# Patient Record
Sex: Female | Born: 1970 | Hispanic: Yes | Marital: Married | Smoking: Never smoker
Health system: Southern US, Community
[De-identification: ages and names within clinical notes are randomized; demographics above are authoritative.]

## PROBLEM LIST (undated history)

## (undated) DIAGNOSIS — I10 Essential (primary) hypertension: Secondary | ICD-10-CM

## (undated) DIAGNOSIS — K219 Gastro-esophageal reflux disease without esophagitis: Secondary | ICD-10-CM

## (undated) HISTORY — PX: APPENDECTOMY: SHX54

## (undated) HISTORY — DX: Essential (primary) hypertension: I10

## (undated) HISTORY — DX: Gastro-esophageal reflux disease without esophagitis: K21.9

## (undated) HISTORY — PX: CHOLECYSTECTOMY: SHX55

---

## 2013-03-28 ENCOUNTER — Emergency Department: Payer: Self-pay | Admitting: Emergency Medicine

## 2013-03-28 LAB — COMPREHENSIVE METABOLIC PANEL
BUN: 9 mg/dL (ref 7–18)
Bilirubin,Total: 0.3 mg/dL (ref 0.2–1.0)
Co2: 27 mmol/L (ref 21–32)
Creatinine: 0.65 mg/dL (ref 0.60–1.30)
Glucose: 108 mg/dL — ABNORMAL HIGH (ref 65–99)
Potassium: 3.2 mmol/L — ABNORMAL LOW (ref 3.5–5.1)
SGOT(AST): 25 U/L (ref 15–37)
SGPT (ALT): 26 U/L (ref 12–78)
Sodium: 139 mmol/L (ref 136–145)
Total Protein: 7.7 g/dL (ref 6.4–8.2)

## 2013-03-28 LAB — CBC
HCT: 41.8 % (ref 35.0–47.0)
HGB: 14.6 g/dL (ref 12.0–16.0)
RBC: 4.74 10*6/uL (ref 3.80–5.20)

## 2013-03-29 LAB — TROPONIN I: Troponin-I: <0.02 ng/mL

## 2013-10-25 ENCOUNTER — Emergency Department: Payer: Self-pay | Admitting: Emergency Medicine

## 2013-10-25 LAB — COMPREHENSIVE METABOLIC PANEL
ALT: 23 U/L (ref 12–78)
ANION GAP: 8 (ref 7–16)
AST: 17 U/L (ref 15–37)
Albumin: 3.5 g/dL (ref 3.4–5.0)
Alkaline Phosphatase: 83 U/L
BUN: 16 mg/dL (ref 7–18)
Bilirubin,Total: 0.2 mg/dL (ref 0.2–1.0)
CREATININE: 0.68 mg/dL (ref 0.60–1.30)
Calcium, Total: 8.5 mg/dL (ref 8.5–10.1)
Chloride: 108 mmol/L — ABNORMAL HIGH (ref 98–107)
Co2: 23 mmol/L (ref 21–32)
EGFR (Non-African Amer.): >60
GLUCOSE: 130 mg/dL — AB (ref 65–99)
Osmolality: 280 (ref 275–301)
POTASSIUM: 3.4 mmol/L — AB (ref 3.5–5.1)
SODIUM: 139 mmol/L (ref 136–145)
Total Protein: 7.5 g/dL (ref 6.4–8.2)

## 2013-10-25 LAB — URINALYSIS, COMPLETE
BACTERIA: NONE SEEN
BILIRUBIN, UR: NEGATIVE
BLOOD: NEGATIVE
Glucose,UR: 50 mg/dL (ref 0–75)
KETONE: NEGATIVE
Leukocyte Esterase: NEGATIVE
NITRITE: NEGATIVE
Ph: 6 (ref 4.5–8.0)
Protein: NEGATIVE
RBC, UR: NONE SEEN /HPF (ref 0–5)
Specific Gravity: 1.006 (ref 1.003–1.030)
Squamous Epithelial: <1
WBC UR: NONE SEEN /HPF (ref 0–5)

## 2013-10-25 LAB — CBC
HCT: 39 % (ref 35.0–47.0)
HGB: 12.8 g/dL (ref 12.0–16.0)
MCH: 29.3 pg (ref 26.0–34.0)
MCHC: 32.9 g/dL (ref 32.0–36.0)
MCV: 89 fL (ref 80–100)
PLATELETS: 230 10*3/uL (ref 150–440)
RBC: 4.38 10*6/uL (ref 3.80–5.20)
RDW: 13.2 % (ref 11.5–14.5)
WBC: 8.5 10*3/uL (ref 3.6–11.0)

## 2013-10-25 LAB — TROPONIN I
Troponin-I: <0.02 ng/mL
Troponin-I: <0.02 ng/mL

## 2013-10-25 LAB — LIPASE, BLOOD: LIPASE: 309 U/L (ref 73–393)

## 2014-01-21 ENCOUNTER — Emergency Department: Payer: Self-pay | Admitting: Emergency Medicine

## 2014-03-31 ENCOUNTER — Observation Stay: Payer: Self-pay | Admitting: Surgery

## 2014-03-31 LAB — CBC WITH DIFFERENTIAL/PLATELET
BASOS ABS: 0.1 10*3/uL (ref 0.0–0.1)
Basophil %: 0.3 %
EOS ABS: 0 10*3/uL (ref 0.0–0.7)
Eosinophil %: 0 %
HCT: 42 % (ref 35.0–47.0)
HGB: 14.1 g/dL (ref 12.0–16.0)
Lymphocyte #: 1.2 10*3/uL (ref 1.0–3.6)
Lymphocyte %: 6.9 %
MCH: 29.5 pg (ref 26.0–34.0)
MCHC: 33.6 g/dL (ref 32.0–36.0)
MCV: 88 fL (ref 80–100)
MONO ABS: 0.2 x10 3/mm (ref 0.2–0.9)
Monocyte %: 1.4 %
NEUTROS PCT: 91.4 %
Neutrophil #: 15.6 10*3/uL — ABNORMAL HIGH (ref 1.4–6.5)
PLATELETS: 261 10*3/uL (ref 150–440)
RBC: 4.78 10*6/uL (ref 3.80–5.20)
RDW: 13.3 % (ref 11.5–14.5)
WBC: 17.1 10*3/uL — ABNORMAL HIGH (ref 3.6–11.0)

## 2014-03-31 LAB — URINALYSIS, COMPLETE
BACTERIA: NONE SEEN
Bilirubin,UR: NEGATIVE
Glucose,UR: >=500 mg/dL (ref 0–75)
KETONE: NEGATIVE
LEUKOCYTE ESTERASE: NEGATIVE
NITRITE: NEGATIVE
Ph: 7 (ref 4.5–8.0)
Protein: NEGATIVE
RBC,UR: 5 /HPF (ref 0–5)
Specific Gravity: 1.017 (ref 1.003–1.030)
WBC UR: 1 /HPF (ref 0–5)

## 2014-03-31 LAB — COMPREHENSIVE METABOLIC PANEL
ALBUMIN: 3.7 g/dL (ref 3.4–5.0)
ANION GAP: 9 (ref 7–16)
AST: 32 U/L (ref 15–37)
Alkaline Phosphatase: 112 U/L
BILIRUBIN TOTAL: 0.3 mg/dL (ref 0.2–1.0)
BUN: 13 mg/dL (ref 7–18)
CO2: 25 mmol/L (ref 21–32)
Calcium, Total: 8.3 mg/dL — ABNORMAL LOW (ref 8.5–10.1)
Chloride: 103 mmol/L (ref 98–107)
Creatinine: 0.75 mg/dL (ref 0.60–1.30)
EGFR (African American): >60
Glucose: 133 mg/dL — ABNORMAL HIGH (ref 65–99)
OSMOLALITY: 276 (ref 275–301)
POTASSIUM: 3.7 mmol/L (ref 3.5–5.1)
SGPT (ALT): 39 U/L
Sodium: 137 mmol/L (ref 136–145)
Total Protein: 8 g/dL (ref 6.4–8.2)

## 2014-03-31 LAB — TROPONIN I

## 2014-03-31 LAB — PREGNANCY, URINE: PREGNANCY TEST, URINE: NEGATIVE m[IU]/mL

## 2014-03-31 LAB — LIPASE, BLOOD: Lipase: 176 U/L (ref 73–393)

## 2014-04-04 LAB — PATHOLOGY REPORT

## 2014-08-24 IMAGING — CR DG CHEST 2V
1 series · 2 of 2 positions shown · non-contrast
Comparison: none

REASON FOR EXAM: chest pain
COMMENTS:   May transport without cardiac monitor

PROCEDURE:     DXR - DXR CHEST PA (OR AP) AND LATERAL  - March 29, 2013  [DATE]
RESULT:     The lungs are clear. The heart and pulmonary vessels are normal.
The bony and mediastinal structures are unremarkable. There is no effusion.
There is no pneumothorax or evidence of congestive failure.

[Series 1: w chest pa · 0.14mm/px · 2 of 2 slices shown]
[im 1/2]
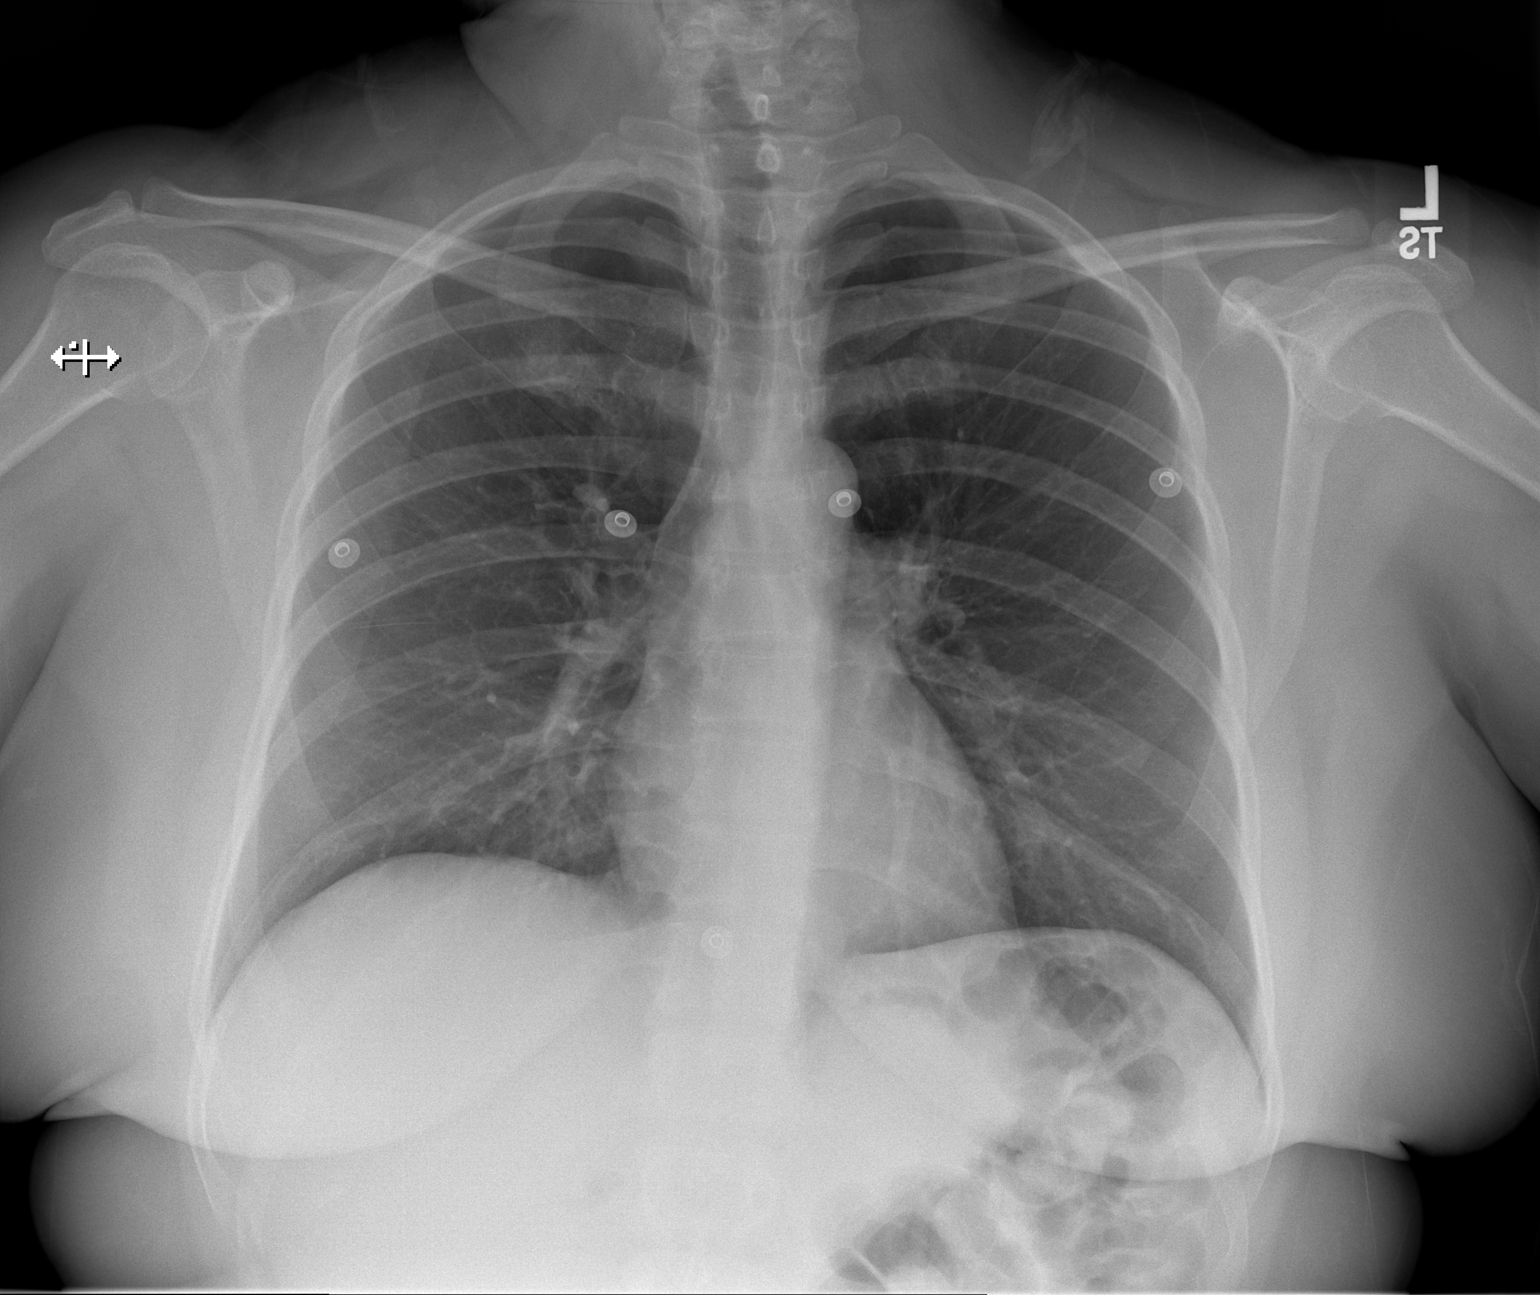
[im 2/2]
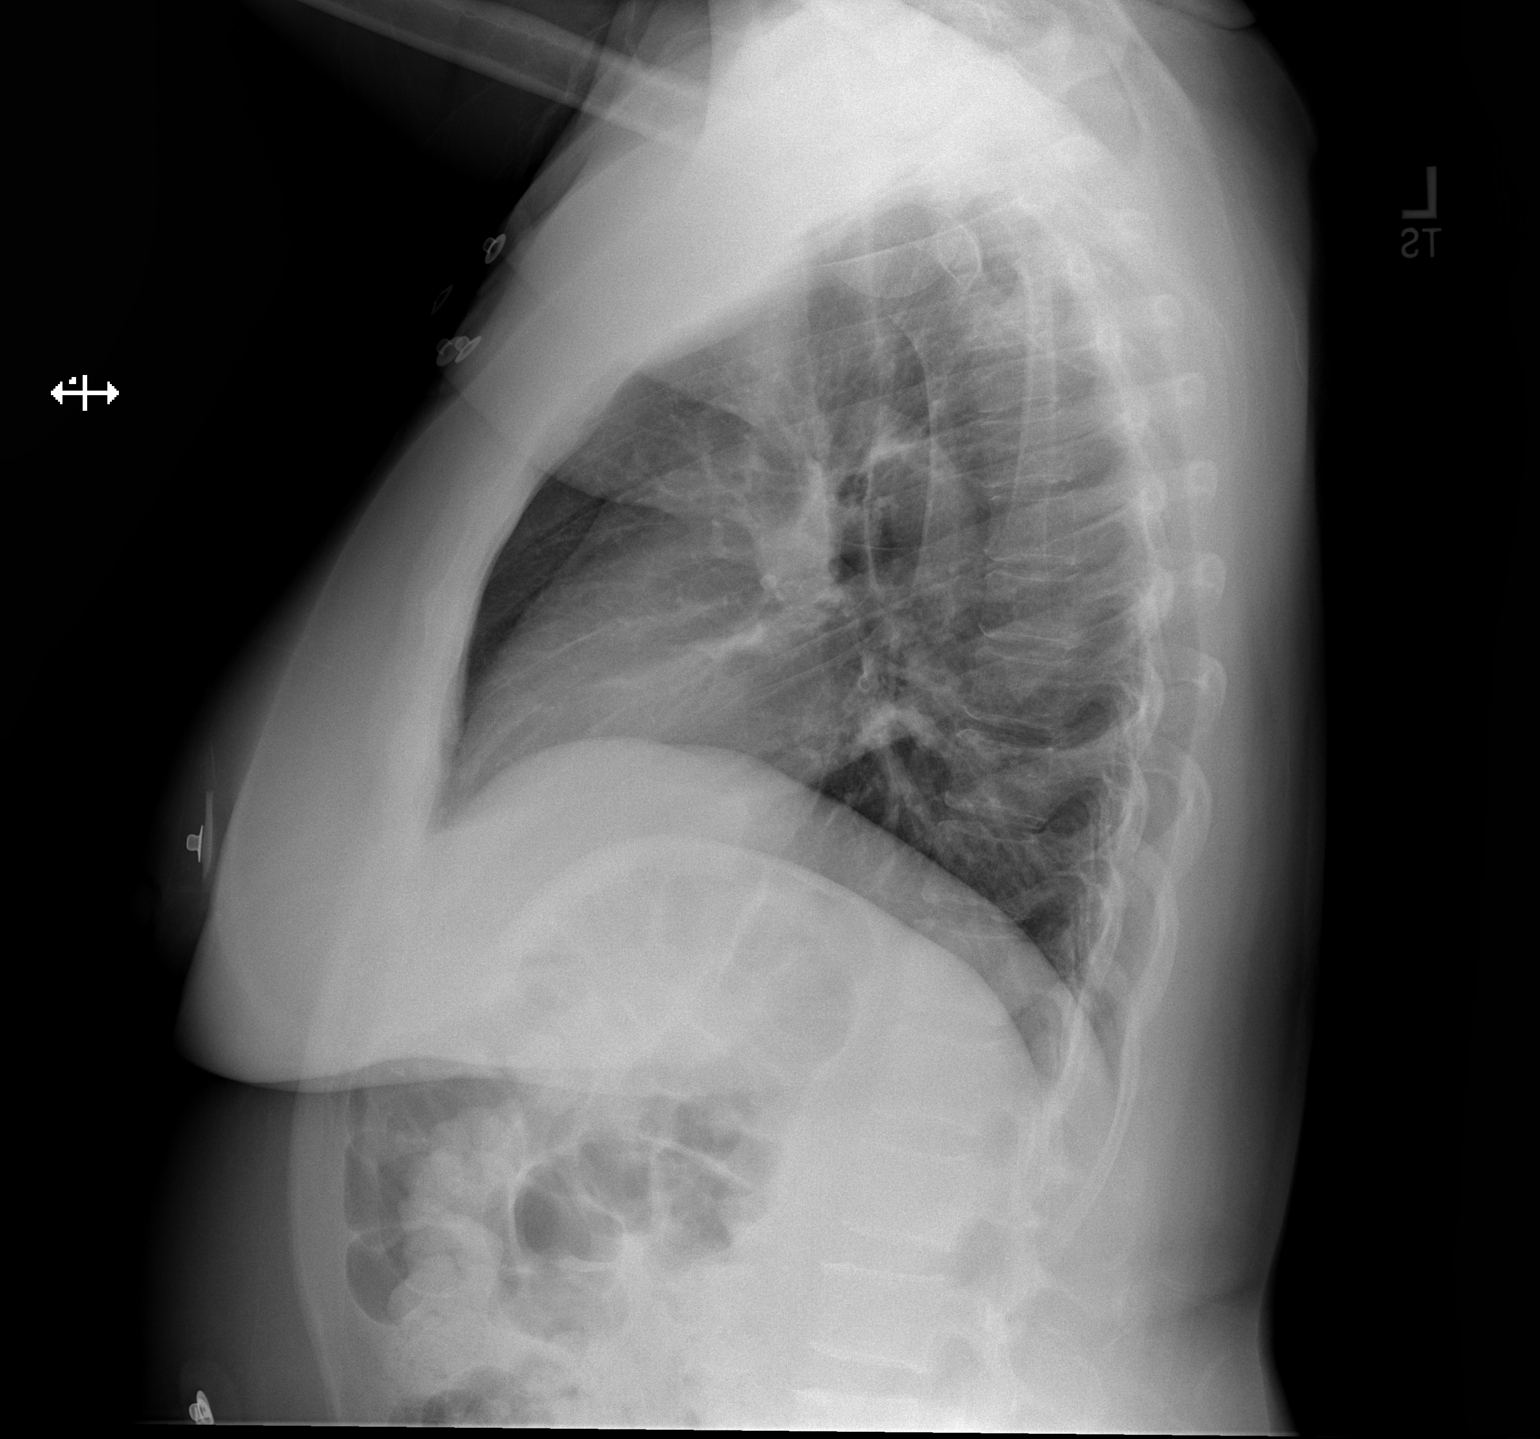

[2 of 2 positions shown; findings below may reference images not displayed]

IMPRESSION: No acute cardiopulmonary disease.

[REDACTED]

## 2014-11-16 NOTE — H&P (Signed)
History of Present Illness 44 yo Spanish-speaking female; Hx via interpreter and 44 year old English-speaking daughter: Nausea, vomiting, and abdominal pain since last PM, unable to sleep. Last meal 2 PM yesterday. Pain worse in RLQ.   Past Med/Surgical Hx:  Syncope:   Hypotension:   C-Section:   ALLERGIES:  No Known Allergies:   HOME MEDICATIONS: Medication Instructions Status  Norco 325 mg-5 mg oral tablet 1 tab(s) orally every 8 hours as needed for pain Active  omeprazole 20 mg oral delayed release capsule 1 cap(s) orally once a day Active   Family and Social History:  Family History Non-Contributory   Social History negative tobacco, negative ETOH, negative Illicit drugs, not married, family of 40, works in Building services engineer of [employer]   Review of Systems:  Fever/Chills No   Cough No   Sputum No   Abdominal Pain Yes   Diarrhea No   Constipation No   Nausea/Vomiting Yes   SOB/DOE No   Chest Pain No   Dysuria No   Tolerating PT Yes   Tolerating Diet No  Nauseated  Vomiting   Medications/Allergies Reviewed Medications/Allergies reviewed   Physical Exam:  GEN well developed, well nourished, obese, emotional distress   HEENT pink conjunctivae, PERRL, hearing intact to voice, moist oral mucosa, Oropharynx clear   NECK supple  No masses  trachea midline   RESP normal resp effort  clear BS  no use of accessory muscles   CARD regular rate  no murmur  no JVD   ABD positive tenderness  nondistended, obese, worse in RLQ   LYMPH negative neck   EXTR negative cyanosis/clubbing, negative edema   SKIN normal to palpation, skin turgor good   NEURO cranial nerves intact, negative tremor, follows commands, motor/sensory function intact   PSYCH alert, A+O to time, place, person   Lab Results: Hepatic:  06-Sep-15 06:08   Bilirubin, Total 0.3  Alkaline Phosphatase 112 (46-116 NOTE: New Reference Range 02/12/14)  SGPT (ALT) 39 (14-63 NOTE: New  Reference Range 02/12/14)  SGOT (AST) 32  Total Protein, Serum 8.0  Albumin, Serum 3.7  Routine Chem:  06-Sep-15 06:08   Glucose, Serum  133  BUN 13  Creatinine (comp) 0.75  Sodium, Serum 137  Potassium, Serum 3.7  Chloride, Serum 103  CO2, Serum 25  Calcium (Total), Serum  8.3  Osmolality (calc) 276  eGFR (African American) >60  eGFR (Non-African American) >60 (eGFR values <65m/min/1.73 m2 may be an indication of chronic kidney disease (CKD). Calculated eGFR is useful in patients with stable renal function. The eGFR calculation will not be reliable in acutely ill patients when serum creatinine is changing rapidly. It is not useful in  patients on dialysis. The eGFR calculation may not be applicable to patients at the low and high extremes of body sizes, pregnant women, and vegetarians.)  Anion Gap 9  Lipase 176 (Result(s) reported on 31 Mar 2014 at 06:43AM.)  Cardiac:  06-Sep-15 06:08   Troponin I < 0.02 (0.00-0.05 0.05 ng/mL or less: NEGATIVE  Repeat testing in 3-6 hrs  if clinically indicated. >0.05 ng/mL: POTENTIAL  MYOCARDIAL INJURY. Repeat  testing in 3-6 hrs if  clinically indicated. NOTE: An increase or decrease  of 30% or more on serial  testing suggests a  clinically important change)  Routine UA:  06-Sep-15 06:08   Color (UA) Yellow  Clarity (UA) Clear  Glucose (UA) >=500  Bilirubin (UA) Negative  Ketones (UA) Negative  Specific Gravity (UA) 1.017  Blood (UA) 1+  pH (UA) 7.0  Protein (UA) Negative  Nitrite (UA) Negative  Leukocyte Esterase (UA) Negative (Result(s) reported on 31 Mar 2014 at 06:52AM.)  RBC (UA) 5 /HPF  WBC (UA) 1 /HPF  Bacteria (UA) NONE SEEN  Epithelial Cells (UA) 1 /HPF  Mucous (UA) PRESENT (Result(s) reported on 31 Mar 2014 at [facility].)  Routine Sero:  06-Sep-15 06:08   Pregnancy Test, Urine NEGATIVE (The results of the qualitative urine HCG (Pregnancy Test) should be evaluated in light of other clinical information.  There  are limitations to the test which, in certain clinical situations, may result in a false positive or negative result. Thehigh dose hook effect can occur in urine samples with extremely high HCG concentrations.  This effect can produce a negative result in certain situations. It is suggested that results of the qualitative HCG be confirmed by an alternate methodology, such as the quantitative serum beta HCG test.)  Routine Hem:  06-Sep-15 06:08   WBC (CBC)  17.1  RBC (CBC) 4.78  Hemoglobin (CBC) 14.1  Hematocrit (CBC) 42.0  Platelet Count (CBC) 261  MCV 88  MCH 29.5  MCHC 33.6  RDW 13.3  Neutrophil % 91.4  Lymphocyte % 6.9  Monocyte % 1.4  Eosinophil % 0.0  Basophil % 0.3  Neutrophil #  15.6  Lymphocyte # 1.2  Monocyte # 0.2  Eosinophil # 0.0  Basophil # 0.1 (Result(s) reported on 31 Mar 2014 at [facility].)   Radiology Results: LabUnknown:    06-Sep-15 09:35, CT Abdomen and Pelvis With Contrast  PACS Image  CT:  CT Abdomen and Pelvis With Contrast  REASON FOR EXAM:    (1) RLQ pain; (2) Same;    NOTE: Nursing to Give Oral   CT Contrast  COMMENTS:   May transport without cardiac monitor    PROCEDURE: CT  - CT ABDOMEN / PELVIS  W  - Mar 31 2014  9:35AM     CLINICAL DATA:  Abdominal pain    EXAM:  CT ABDOMEN AND PELVIS WITH CONTRAST    TECHNIQUE:  Multidetector CT imaging of the abdomen and pelvis was performed  using the standard protocol following bolus administration of  intravenous contrast.  CONTRAST:  100 cc Isovue 300    COMPARISON:  None.    FINDINGS:  Acute appendicitis. The appendix is distended and fluid filled with  an appendicolith. Stranding in the adjacent fat. Small adjacent  mesocolon nodes. No extraluminal bowel gas. No abscess. =    Sub cm hypodensities in the liver are nonspecific.    Spleen, pancreas, adrenal glands, kidneys are normal.    Bladder is distended.  Uterus and adnexa are within normal limits.     IMPRESSION:  Acute  appendicitis. Critical Value/emergent results were called by  telephone at the time of interpretation on 03/31/2014 at 9:44 am to  Dr. Harvest Dark , who verbally acknowledged these results.    Nonspecific hypodensities in the liver. If the patient has a history  of malignancy, consider follow-up MRI in 6 months.      Electronically Signed    By: Maryclare Bean M.D.    On: 03/31/2014 09:44     Verified By: Jamas Lav, M.D.,    Assessment/Admission Diagnosis Acute appendicitis   Plan IVF, IV ABx, lap appy pt understands and agrees   Electronic Signatures: Consuela Mimes (MD)  (Signed 06-Sep-15 10:12)  Authored: CHIEF COMPLAINT and HISTORY, PAST MEDICAL/SURGIAL HISTORY, ALLERGIES, HOME MEDICATIONS, FAMILY AND  SOCIAL HISTORY, REVIEW OF SYSTEMS, PHYSICAL EXAM, LABS, Radiology, ASSESSMENT AND PLAN   Last Updated: 06-Sep-15 10:12 by Consuela Mimes (MD)

## 2014-11-16 NOTE — Op Note (Signed)
PATIENT NAME:  [name], Martha Wolfe MR#:  [id number] DATE OF BIRTH:  1970/12/28  DATE OF PROCEDURE:  03/31/2014  PREOPERATIVE DIAGNOSIS: Acute appendicitis.   POSTOPERATIVE DIAGNOSIS: Acute appendicitis, nonsuppurative.   SURGEON: Claude MangesWilliam f Latravious Levitt, M.D.   ANESTHESIA: General.   OPERATION PERFORMED: Laparoscopic appendectomy.   PROCEDURE IN DETAIL: The patient was placed supine on the operating room table and prepped and draped in the usual sterile fashion. A Hasson cannula was introduced via the supraumbilical midline within 2 horizontal mattress sutures of 0 Vicryl and a 15 mmHg CO2 pneumoperitoneum was created. Two additional 5 mm trocars were placed. The appendix was in a retrocecal location. It was dissected out at the base where it joined the cecum and was divided here with an Endo GIA stapling device and then the remainder of the appendectomy was performed with the Harmonic scalpel taking down the mesoappendix and the retrocecal adhesions. The appendix was placed in an Endo Catch bag and extracted from the abdomen via the epigastric port site and the right gutter was then irrigated with warm normal saline and this was suctioned clear. It was also suctioned clear from the pelvis and the perihepatic area near Morison's pouch. The peritoneum was desufflated and decannulated and the previously placed Vicryl were tied one to another, closing the anterior abdominal wall fascia and all 3 skin sites were closed with subcuticular 5-0 Monocryl and suture strips. The patient tolerated the procedure well. There were no complications.    ____________________________ Claude MangesWilliam F. Shulem Mader, MD wfm:nr D: 03/31/2014 12:46:07 ET T: 03/31/2014 13:58:38 ET JOB#: [id number]  cc: Claude MangesWilliam F. Izabell Schalk, MD, <Dictator> Claude MangesWILLIAM F Takako Minckler MD ELECTRONICALLY SIGNED 04/08/2014 1:03

## 2015-03-22 IMAGING — CR DG CHEST 1V PORT
1 series · 1 of 1 positions shown · non-contrast
Comparison: 03/29/2013

CLINICAL DATA: Chest pain, difficulty breathing

EXAM:
PORTABLE CHEST - 1 VIEW

[ap]
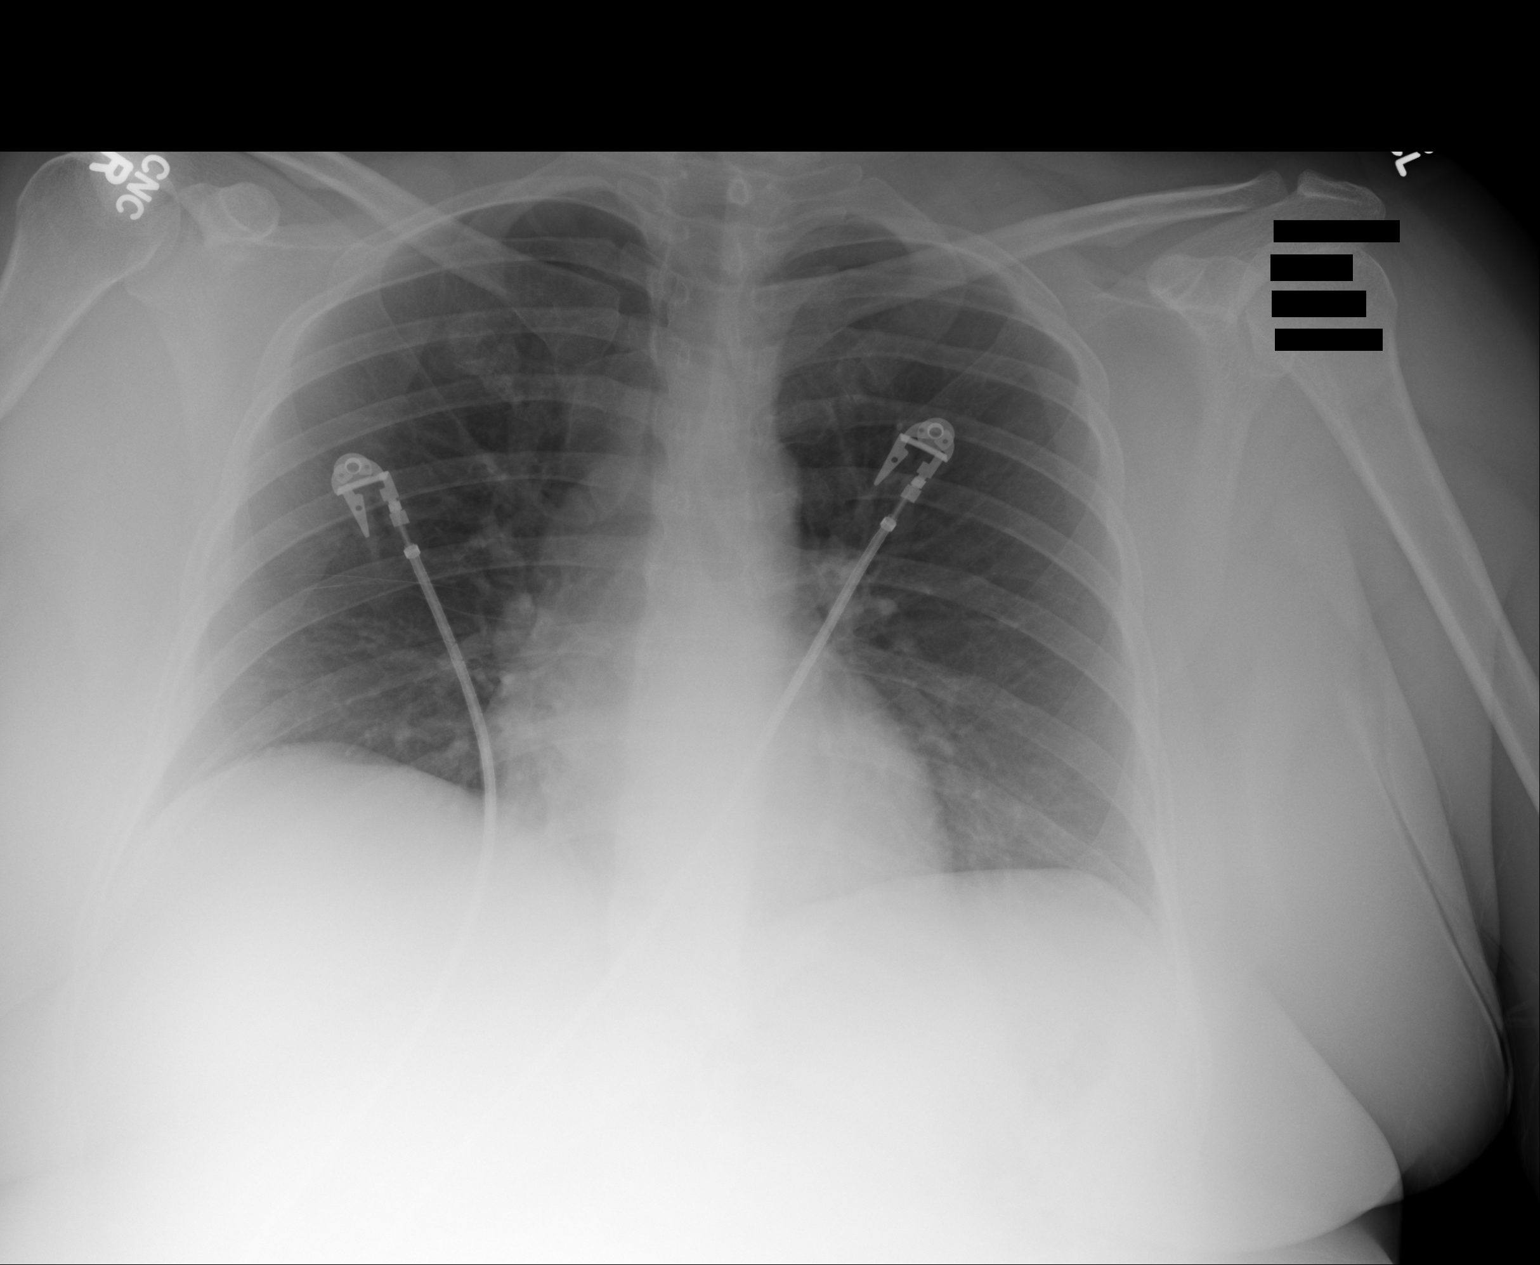

[1 of 1 positions shown; findings below may reference images not displayed]

FINDINGS: The heart size and mediastinal contours are within normal limits.
Both lungs are clear. The visualized skeletal structures are
unremarkable.
IMPRESSION: No active disease.

## 2017-06-14 DIAGNOSIS — R079 Chest pain, unspecified: Secondary | ICD-10-CM

## 2017-06-15 DIAGNOSIS — R079 Chest pain, unspecified: Secondary | ICD-10-CM

## 2017-06-20 DIAGNOSIS — R0789 Other chest pain: Secondary | ICD-10-CM

## 2017-06-20 HISTORY — DX: Other chest pain: R07.89

## 2017-06-22 ENCOUNTER — Ambulatory Visit (INDEPENDENT_AMBULATORY_CARE_PROVIDER_SITE_OTHER): Payer: Self-pay | Admitting: Cardiology

## 2017-06-22 ENCOUNTER — Encounter: Payer: Self-pay | Admitting: Cardiology

## 2017-06-22 VITALS — BP 130/64 | HR 72 | Resp 12 | Wt 170.0 lb

## 2017-06-22 DIAGNOSIS — G4700 Insomnia, unspecified: Secondary | ICD-10-CM | POA: Insufficient documentation

## 2017-06-22 DIAGNOSIS — E785 Hyperlipidemia, unspecified: Secondary | ICD-10-CM | POA: Insufficient documentation

## 2017-06-22 DIAGNOSIS — R0789 Other chest pain: Secondary | ICD-10-CM

## 2017-06-22 DIAGNOSIS — F5101 Primary insomnia: Secondary | ICD-10-CM

## 2017-06-22 HISTORY — DX: Hyperlipidemia, unspecified: E78.5

## 2017-06-22 HISTORY — DX: Insomnia, unspecified: G47.00

## 2017-06-22 MED ORDER — NITROGLYCERIN 0.4 MG SL SUBL: 0.4000 mg | SUBLINGUAL_TABLET | SUBLINGUAL | 6 refills | Status: DC | PRN

## 2017-06-22 NOTE — Patient Instructions (Addendum)
Medication Instructions:  Your physician has recommended you make the following change in your medication:  Toma Nitroglycerin cuando tienes dolor del pecho  Labwork: None  Testing/Procedures: None  Follow-Up: Your physician recommends that you schedule a follow-up appointment in: Nos vamos a llamar en 3 semanas  Any Other Special Instructions Will Be Listed Below (If Applicable).     If you need a refill on your cardiac medications before your next appointment, please call your pharmacy.   [facility]  Garey HamAshley A, RN, BSN

## 2017-06-22 NOTE — Progress Notes (Signed)
Cardiology Office Note:    Date:  06/22/2017   ID:  Martha Wolfe, DOB 1970/07/27, MRN [id number]  PCP:  Patient, No Pcp Per  Cardiologist:  Gypsy Balsamobert Jeremie Giangrande, MD    Referring MD: No ref. provider found   Chief Complaint  Patient presents with  . Hospitalization Follow-up  Doing better but still have some chest pain  History of Present Illness:    Martha Wolfe Martha Wolfe is a 46 y.o. female  with history of chest pain. She described pain as pressure in the chest not related to exertion it can last a few minutes, there is no radiation, no sweating no shortness of breath associated with the sensation. She recently ended up coming to the emergency room she rule out for microinfarction, she did have a stress test which was read by radiologist of her small questionable defect however which was read after that by cardiologist who said that stress this is normal. She still described to have some chest pain on and off again not related to exercise on the physical examination I can reproduce partially the pain by pressing left side of her chest. She tells me when she came to the emergency room to give her nitroglycerin that helps with the pain a lot.  Past Medical History:  Diagnosis Date  . Gastroesophageal reflux   . Hypertension       Current Medications: Current Meds  Medication Sig  . aspirin 325 MG EC tablet Take 325 mg by mouth daily.  Marland Kitchen. atorvastatin (LIPITOR) 20 MG tablet Take 20 mg by mouth daily.  . metoprolol tartrate (LOPRESSOR) 25 MG tablet Take 12.5 mg by mouth 2 (two) times daily.  . pantoprazole (PROTONIX) 40 MG tablet Take 40 mg by mouth daily.     Allergies:   Patient has no known allergies.   Social History   Socioeconomic History  . Marital status: Married    Spouse name: None  . Number of children: None  . Years of education: None  . Highest education level: None  Social Needs  . Financial resource strain: None  . Food insecurity - worry: None    . Food insecurity - inability: None  . Transportation needs - medical: None  . Transportation needs - non-medical: None  Occupational History  . None  Tobacco Use  . Smoking status: Never Smoker  . Smokeless tobacco: Never Used  Substance and Sexual Activity  . Alcohol use: No    Frequency: Never  . Drug use: No  . Sexual activity: None  Other Topics Concern  . None  Social History Narrative  . None     Family History: The patient's family history is not on file. ROS:   Please see the history of present illness.    All 14 point review of systems negative except as described per history of present illness  EKGs/Labs/Other Studies Reviewed:      Recent Labs: No results found for requested labs within last 8760 hours.  Recent Lipid Panel No results found for: CHOL, TRIG, HDL, CHOLHDL, VLDL, LDLCALC, LDLDIRECT  Physical Exam:    VS:  BP 130/64   Pulse 72   Resp 12   Wt 170 lb (77.1 kg)     Wt Readings from Last 3 Encounters:  06/22/17 170 lb (77.1 kg)     GEN:  Well nourished, well developed in no acute distress HEENT: Normal NECK: No JVD; No carotid bruits LYMPHATICS: No lymphadenopathy CARDIAC: RRR, no murmurs, no rubs, no gallops  RESPIRATORY:  Clear to auscultation without rales, wheezing or rhonchi  ABDOMEN: Soft, non-tender, non-distended MUSCULOSKELETAL:  No edema; No deformity  SKIN: Warm and dry LOWER EXTREMITIES: no swelling NEUROLOGIC:  Alert and oriented x 3 PSYCHIATRIC:  Normal affect   ASSESSMENT:    1. Atypical chest pain   2. Dyslipidemia   3. Primary insomnia    PLAN:    In order of problems listed above:  1. Atypical chest pain: Recent stresses apparently normal. I will give her a trial of nitroglycerin to see if that helps if that will help she may require to long-acting nitroglycerin. 2. Dyslipidemia: She is taking 20 mg of Lipitor which I will continue. Will check her fasting lipid profile in a month 3. Primary insomnia we'll  give for name of my opinion and will ask her to try it.  Overall quite confusing stories but majority future of the pain she described an look like very atypical. I will see her back in my office in about one month to see how she does.   Medication Adjustments/Labs and Tests Ordered: Current medicines are reviewed at length with the patient today.  Concerns regarding medicines are outlined above.  No orders of the defined types were placed in this encounter.  Medication changes: No orders of the defined types were placed in this encounter.   Signed, Georgeanna Leaobert J. Kenson Groh, MD, [facility] Hospital[facility]ACC 06/22/2017 4:27 PM    [facility]

## 2017-07-21 ENCOUNTER — Telehealth: Payer: Self-pay

## 2017-07-21 NOTE — Telephone Encounter (Signed)
Called to check on the patient per Dr. Vanetta ShawlKRasowski's request. Spoke with the daughter and she stated that her mother is doing well and has returned to work with no issues. Requested that she call if she had any further concerns.

## 2017-07-22 DIAGNOSIS — I10 Essential (primary) hypertension: Secondary | ICD-10-CM | POA: Insufficient documentation

## 2017-07-22 HISTORY — DX: Essential (primary) hypertension: I10

## 2018-02-16 DIAGNOSIS — I959 Hypotension, unspecified: Secondary | ICD-10-CM

## 2018-02-16 DIAGNOSIS — R531 Weakness: Secondary | ICD-10-CM

## 2018-02-16 DIAGNOSIS — R0789 Other chest pain: Secondary | ICD-10-CM

## 2018-02-16 DIAGNOSIS — E860 Dehydration: Secondary | ICD-10-CM

## 2018-02-16 DIAGNOSIS — R55 Syncope and collapse: Secondary | ICD-10-CM

## 2018-02-17 DIAGNOSIS — R55 Syncope and collapse: Secondary | ICD-10-CM

## 2018-03-23 DIAGNOSIS — R55 Syncope and collapse: Secondary | ICD-10-CM

## 2018-03-23 HISTORY — DX: Syncope and collapse: R55

## 2018-03-29 ENCOUNTER — Telehealth: Payer: Self-pay | Admitting: *Deleted

## 2018-03-29 NOTE — Telephone Encounter (Signed)
Requested notes faxed over for continuation of care. Faxed [place] an official request and we faxed notes over from visit with Dr. Bing Matter.

## 2018-06-15 DIAGNOSIS — K219 Gastro-esophageal reflux disease without esophagitis: Secondary | ICD-10-CM

## 2018-06-15 HISTORY — DX: Gastro-esophageal reflux disease without esophagitis: K21.9

## 2021-11-24 DIAGNOSIS — R7303 Prediabetes: Secondary | ICD-10-CM

## 2021-11-24 HISTORY — DX: Prediabetes: R73.03

## 2021-12-03 ENCOUNTER — Encounter: Payer: Self-pay | Admitting: *Deleted

## 2021-12-03 ENCOUNTER — Encounter: Payer: Self-pay | Admitting: Cardiology

## 2021-12-07 ENCOUNTER — Ambulatory Visit (INDEPENDENT_AMBULATORY_CARE_PROVIDER_SITE_OTHER): Payer: Self-pay

## 2021-12-07 ENCOUNTER — Ambulatory Visit (INDEPENDENT_AMBULATORY_CARE_PROVIDER_SITE_OTHER): Payer: Self-pay | Admitting: Cardiology

## 2021-12-07 ENCOUNTER — Encounter: Payer: Self-pay | Admitting: Cardiology

## 2021-12-07 VITALS — BP 100/72 | HR 75 | Ht <= 58 in | Wt 196.6 lb

## 2021-12-07 DIAGNOSIS — R002 Palpitations: Secondary | ICD-10-CM

## 2021-12-07 DIAGNOSIS — R7303 Prediabetes: Secondary | ICD-10-CM

## 2021-12-07 DIAGNOSIS — R42 Dizziness and giddiness: Secondary | ICD-10-CM | POA: Insufficient documentation

## 2021-12-07 DIAGNOSIS — R0609 Other forms of dyspnea: Secondary | ICD-10-CM

## 2021-12-07 DIAGNOSIS — R0789 Other chest pain: Secondary | ICD-10-CM

## 2021-12-07 DIAGNOSIS — E785 Hyperlipidemia, unspecified: Secondary | ICD-10-CM

## 2021-12-07 HISTORY — DX: Other forms of dyspnea: R06.09

## 2021-12-07 HISTORY — DX: Dizziness and giddiness: R42

## 2021-12-07 NOTE — Addendum Note (Signed)
Addended by: Jacobo Forest D on: 12/07/2021 10:12 AM ? ? Modules accepted: Orders ? ?

## 2021-12-07 NOTE — Patient Instructions (Signed)
Medication Instructions:  ?Your physician recommends that you continue on your current medications as directed. Please refer to the Current Medication list given to you today.  ?*If you need a refill on your cardiac medications before your next appointment, please call your pharmacy* ? ? ?Lab Work: ?LDL- Direct- today ?If you have labs (blood work) drawn today and your tests are completely normal, you will receive your results only by: ?MyChart Message (if you have MyChart) OR ?A paper copy in the mail ?If you have any lab test that is abnormal or we need to change your treatment, we will call you to review the results. ? ? ?Testing/Procedures: ?Your physician has requested that you have an echocardiogram. Echocardiography is a painless test that uses sound waves to create images of your heart. It provides your doctor with information about the size and shape of your heart and how well your heart?s chambers and valves are working. This procedure takes approximately one hour. There are no restrictions for this procedure.  ? ? ?WHY IS MY DOCTOR PRESCRIBING ZIO? ?The Zio system is proven and trusted by physicians to detect and diagnose irregular heart rhythms -- and has been prescribed to hundreds of thousands of patients. ? ?The FDA has cleared the Zio system to monitor for many different kinds of irregular heart rhythms. In a study, physicians were able to reach a diagnosis 90% of the time with the Zio system1. ? ?You can wear the Zio monitor -- a small, discreet, comfortable patch -- during your normal day-to-day activity, including while you sleep, shower, and exercise, while it records every single heartbeat for analysis. ? ?1Barrett, P., et al. Comparison of 24 Hour Holter Monitoring Versus 14 Day Novel Adhesive Patch Electrocardiographic Monitoring. American Journal of Medicine, 2014. ? ?ZIO VS. HOLTER MONITORING ?The Zio monitor can be comfortably worn for up to 14 days. Holter monitors can be worn for 24 to 48  hours, limiting the time to record any irregular heart rhythms you may have. Zio is able to capture data for the 51% of patients who have their first symptom-triggered arrhythmia after 48 hours.1 ? ?LIVE WITHOUT RESTRICTIONS ?The Zio ambulatory cardiac monitor is a small, unobtrusive, and water-resistant patch--you might even forget you?re wearing it. The Zio monitor records and stores every beat of your heart, whether you're sleeping, working out, or showering.   ? ? ?Follow-Up: ?At [facility], you and your health needs are our priority.  As part of our continuing mission to provide you with exceptional heart care, we have created designated Provider Care Teams.  These Care Teams include your primary Cardiologist (physician) and Advanced Practice Providers (APPs -  Physician Assistants and Nurse Practitioners) who all work together to provide you with the care you need, when you need it. ? ?We recommend signing up for the patient portal called "MyChart".  Sign up information is provided on this After Visit Summary.  MyChart is used to connect with patients for Virtual Visits (Telemedicine).  Patients are able to view lab/test results, encounter notes, upcoming appointments, etc.  Non-urgent messages can be sent to your provider as well.   ?To learn more about what you can do with MyChart, go to [email or web address].   ? ?Your next appointment:   ?2 month(s) ? ?The format for your next appointment:   ?In Person ? ?Provider:   ?Gypsy Balsam, MD  ? ? ?Other Instructions ?NA  ?

## 2021-12-07 NOTE — Progress Notes (Signed)
? ?Cardiology Consultation:   ? ?Date:  12/07/2021  ? ?ID:  Martha Wolfe, DOB 04/09/1971, MRN [id number] ? ?PCP:  Patient, No Pcp Per (Inactive)  ?Cardiologist:  Gypsy Balsam, MD  ? ?Referring MD: Meda Coffee, FNP  ? ?Chief Complaint  ?Patient presents with  ? Palpitations  ? Dizziness  ? low BP  ? ? ?History of Present Illness:   ? ?Martha Wolfe is a 51 y.o. female who is being seen today for the evaluation of dizziness at the request of Meda Coffee, FNP.  I did see her in 2019 when she was evaluated for atypical chest pain.  Evaluation at that time was unrevealing, stress test performed showed no evidence of myocardial infarction no evidence of ischemia.  She also got dyslipidemia essential hypertension gastroesophageal reflux disease.  What brought her to my office today is the fact that she described to have dizziness and dizziness happen in different situations typically she will have it when she start having some back pain.  She was recently at church was standing became dizzy.  She also feel her heart speeding up beating more forcefully when it happens.  Never to the point of completely passing out but disturbing her enough.  Denies have any chest pain tightness squeezing pressure burning chest.  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  She did not notice any changes within the last few months.  In terms of ability to exercise.  She does not smoke and is overall good. ? ?Past Medical History:  ?Diagnosis Date  ? Atypical chest pain 06/20/2017  ? Dyslipidemia 06/22/2017  ? Essential (primary) hypertension 07/22/2017  ? Gastro-esophageal reflux disease without esophagitis 06/15/2018  ? Hypertension   ? Insomnia 06/22/2017  ? Prediabetes 11/24/2021  ? Syncope and collapse 03/23/2018  ? ? ?Past Surgical History:  ?Procedure Laterality Date  ? APPENDECTOMY    ? CESAREAN SECTION    ?  CHOLECYSTECTOMY    ? ? ?Current Medications: ?No outpatient medications have been marked as taking for the 12/07/21 encounter (Office Visit) with Georgeanna Lea, MD.  ?  ? ?Allergies:   Patient has no known allergies.  ? ?Social History  ? ?Socioeconomic History  ? Marital status: Married  ?  Spouse name: Not on file  ? Number of children: Not on file  ? Years of education: Not on file  ? Highest education level: Not on file  ?Occupational History  ? Not on file  ?Tobacco Use  ? Smoking status: Never  ? Smokeless tobacco: Never  ?Vaping Use  ? Vaping Use: Never used  ?Substance and Sexual Activity  ? Alcohol use: Not Currently  ?  Alcohol/week: 2.0 standard drinks  ?  Types: 2 Glasses of wine per week  ? Drug use: No  ? Sexual activity: Not on file  ?Other Topics Concern  ? Not on file  ?Social History Narrative  ? Not on file  ? ?Social Determinants of Health  ? ?Financial Resource Strain: Not on file  ?Food Insecurity: Not on file  ?Transportation Needs: Not on file  ?Physical Activity: Not on file  ?Stress: Not on file  ?Social Connections: Not on file  ?  ? ?Family History: ?The patient's family history includes Heart attack in her mother; Hypertension in her mother and sister. ?ROS:   ?Please see the history of present illness.    ?All 14 point  review of systems negative except as described per history of present illness. ? ?EKGs/Labs/Other Studies Reviewed:   ? ?The following studies were reviewed today: ?I did review stress test from 2018.  Negative ? ?EKG:  EKG is  ordered today.  The ekg ordered today demonstrates normal sinus rhythm, normal P interval, normal QS complex duration morphology, there is a Q-wave inferiorly but very small and narrow, nonspecific ST segment changes ? ?Recent Labs: ?No results found for requested labs within last 8760 hours.  ?Recent Lipid Panel ?No results found for: CHOL, TRIG, HDL, CHOLHDL, VLDL, LDLCALC, LDLDIRECT ? ?Physical Exam:   ? ?VS:  BP 100/72 (BP Location: Right  Arm, Patient Position: Sitting)   Pulse 75   Ht 4\' 9"  (1.448 m)   Wt 196 lb 9.6 oz (89.2 kg)   SpO2 97%   BMI 42.54 kg/m?    ? ?Wt Readings from Last 3 Encounters:  ?12/07/21 196 lb 9.6 oz (89.2 kg)  ?11/02/21 199 lb (90.3 kg)  ?06/22/17 170 lb (77.1 kg)  ?  ? ?GEN:  Well nourished, well developed in no acute distress ?HEENT: Normal ?NECK: No JVD; No carotid bruits ?LYMPHATICS: No lymphadenopathy ?CARDIAC: RRR, no murmurs, no rubs, no gallops ?RESPIRATORY:  Clear to auscultation without rales, wheezing or rhonchi  ?ABDOMEN: Soft, non-tender, non-distended ?MUSCULOSKELETAL:  No edema; No deformity  ?SKIN: Warm and dry ?NEUROLOGIC:  Alert and oriented x 3 ?PSYCHIATRIC:  Normal affect  ? ?ASSESSMENT:   ? ?1. Dizziness   ?2. Dyspnea on exertion   ? ?PLAN:   ? ?In order of problems listed above: ? ?Difficult started poor historian.  She described to have episode of dizziness.  Also described to have spinning of her heart rate.  Ask her to wear Zio patch for 2 weeks to see if she had any significant arrhythmia.  As a part of evaluation echocardiogram will be done to assess left ventricle ejection fraction. ?Dyspnea on exertion echocardiogram will be done ?Prediabetes that being followed by internal medicine team, I see last hemoglobin A1c being 6.4 this is from 13 days ago.  Obviously exercises and weight loss will be tremendously beneficial. ?Cholesterol status: Unclear.  I will ask her to have fasting lipid profile done. ?In terms of blood pressure being low I encouraged her to drink plenty of fluid. ?Lady with obesity now diabetes comes in with somewhat unclear story will investigate dizziness however I think we also need to concentrate on risk factors modification for future, therefore I think she can benefit from aspirin every single day.  I will check her fasting lipid profile today. ? ? ?Medication Adjustments/Labs and Tests Ordered: ?Current medicines are reviewed at length with the patient today.  Concerns  regarding medicines are outlined above.  ?No orders of the defined types were placed in this encounter. ? ?No orders of the defined types were placed in this encounter. ? ? ?Signed, ?06/24/17, MD, [facility]. ?12/07/2021 9:54 AM    ?[facility] ?

## 2021-12-07 NOTE — Progress Notes (Signed)
G

## 2021-12-08 LAB — LDL CHOLESTEROL, DIRECT: LDL Direct: 124 mg/dL — ABNORMAL HIGH (ref 0–99)

## 2021-12-24 ENCOUNTER — Ambulatory Visit (INDEPENDENT_AMBULATORY_CARE_PROVIDER_SITE_OTHER): Payer: Self-pay

## 2021-12-24 DIAGNOSIS — R0609 Other forms of dyspnea: Secondary | ICD-10-CM

## 2021-12-24 LAB — ECHOCARDIOGRAM COMPLETE
Area-P 1/2: 3.27 cm2
S' Lateral: 2.5 cm

## 2022-01-05 ENCOUNTER — Telehealth: Payer: Self-pay

## 2022-01-05 NOTE — Telephone Encounter (Signed)
Spoke with daughter that translated for pt. Advised that Echocardiogram was ok and Cholesterol was elevated and Dr. Bing Matter would talk with her further at her appt. Pt agreed. Reminded of appt day and time.

## 2022-01-22 ENCOUNTER — Telehealth: Payer: Self-pay

## 2022-01-22 NOTE — Telephone Encounter (Signed)
Patient notified of results.

## 2022-01-22 NOTE — Telephone Encounter (Signed)
-----   Message from Georgeanna Lea, MD sent at 01/20/2022 12:38 PM EDT ----- Normal monitor

## 2022-02-18 ENCOUNTER — Ambulatory Visit: Payer: Self-pay | Admitting: Cardiology

## 2022-03-23 HISTORY — DX: Morbid (severe) obesity due to excess calories: E66.01

## 2023-06-05 DIAGNOSIS — R079 Chest pain, unspecified: Secondary | ICD-10-CM

## 2023-06-06 DIAGNOSIS — R079 Chest pain, unspecified: Secondary | ICD-10-CM

## 2023-06-07 ENCOUNTER — Ambulatory Visit: Payer: Self-pay

## 2023-06-07 VITALS — BP 110/68 | HR 68 | Ht <= 58 in | Wt 211.0 lb

## 2023-06-07 DIAGNOSIS — R9439 Abnormal result of other cardiovascular function study: Secondary | ICD-10-CM

## 2023-06-07 DIAGNOSIS — I340 Nonrheumatic mitral (valve) insufficiency: Secondary | ICD-10-CM | POA: Insufficient documentation

## 2023-06-07 DIAGNOSIS — R55 Syncope and collapse: Secondary | ICD-10-CM

## 2023-06-07 DIAGNOSIS — R0789 Other chest pain: Secondary | ICD-10-CM

## 2023-06-07 HISTORY — DX: Nonrheumatic mitral (valve) insufficiency: I34.0

## 2023-06-07 MED ORDER — METOPROLOL TARTRATE 100 MG PO TABS: ORAL_TABLET | ORAL | 3 refills | Status: DC

## 2023-06-07 NOTE — Assessment & Plan Note (Signed)
No significant interval change, appears less intense on current imaging. Continue to follow-up with repeat imaging in about 2 years.

## 2023-06-07 NOTE — Progress Notes (Signed)
Cardiology Consultation:    Date:  06/07/2023   ID:  Martha Wolfe, DOB April 01, 1971, MRN [id number]  PCP:  Patient, No Pcp Per  Cardiologist:  Marlyn Corporal Trek Kimball, MD   Referring MD: No ref. provider found   No chief complaint on file.    ASSESSMENT AND PLAN:   Martha Wolfe 51 year old woman with history of prediabetes, hypertension, hyperlipidemia, obesity, gastroesophageal reflux disease, prior history of C-sections and appendectomy, mildly abnormal transaminases, recently with an episode of syncope while at work and associated chest discomfort was evaluated at [facility], with negative troponins, unremarkable echocardiogram findings, Lexiscan stress test with nuclear imaging reporting mild reversible mid to apical anterior wall defect attributable to breast attenuation artifact however ischemia could not be excluded.  Here for further evaluation.  Problem List Items Addressed This Visit     Atypical chest pain    Atypical chest pain, noncardiac in description today. Negative troponins. No significant cardiac structure and function abnormalities on echocardiogram inpatient at [facility]. [place] stress test with nuclear imaging however reported mild reversible defect mid to apical anterior wall, breast attenuation artifact versus ischemia.  In this context given her risk factors for cardiovascular disease, we will proceed with further evaluation with CT coronary angiogram.  Optimize heart rates with metoprolol tartrate 100 mg 1 dose on the morning of the study. To hold the morning dose of carvedilol on the morning of the study when she is taking metoprolol tartrate.  Will review the results once available and further follow-up based on test results.      Syncope and collapse    Occurred while at work on 06-04-2023. Appears vasovagal in description in association with low blood pressures with carvedilol dose of 6.25 mg at  the time. Cannot entirely exclude cardiac arrhythmias. Also was associated with chest pain pending further evaluation as below.  In this context will proceed with further evaluation with Zio patch for 14 days. Advised to keep herself well-hydrated.       Mild mitral regurgitation by prior echocardiogram, June 2023; appears trace MR on recent echocardiogram November 2024 at [facility]    No significant interval change, appears less intense on current imaging. Continue to follow-up with repeat imaging in about 2 years.       Relevant Medications   aspirin EC 81 MG tablet   atorvastatin (LIPITOR) 20 MG tablet   carvedilol (COREG) 6.25 MG tablet   nitroGLYCERIN (NITROSTAT) 0.4 MG SL tablet   Other Visit Diagnoses     Abnormal stress test    -  Primary   Relevant Orders   EKG 12-Lead (Completed)       Return to, tentatively to schedule visit in 4 weeks.  History of Present Illness:    Martha Wolfe is a 52 y.o. female who is being seen today for follow-up visit. Her last visit with cardiologist just was Dec 07, 2021 with Dr. Bing Matter. Spanish speaking individual, accompanied by Spanish interpreter to the visit today. 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  Has history of prediabetes, hypertension, hyperlipidemia, obesity, gastroesophageal reflux disease, prior history of C-sections and appendectomy. Denies any prior history of CAD, CHF, MI, CVA  With reported episodes of dizziness in the past after her last visit with cardiologist she underwent Zio patch which was unremarkable for 14 days.  No significant structural and functional issues with transthoracic echocardiogram other than mild mitral regurgitation.  Was recently at [facility] from 06/04/2023  through 06/06/2023, evaluated for syncope that occurred while she was at work, started as symptoms of lightheadedness and sweating, proceeded to sensation of chest pressure squeezing-like in description,  she sat down on the chair with her coworkers assistance, she does not have much memory after that, EMS noted blood pressure to be hypotensive 82/68 mmHg, vitals normalized by the time she was in the emergency room.  Troponin I was unremarkable less than 0.01, less than 0.01.  Chest x-ray was unremarkable.  CTA chest done showed no evidence of pulmonary embolism.  Lexiscan stress test with nuclear imaging was done reported LVEF 71%, mild reversible mid to apical anterior wall defect attributable to breast attenuation however ischemia cannot be completely excluded and was reported to be associated with mild anterior wall hypokinesis.  She was scheduled to follow-up with cardiology as outpatient for further evaluation.  Her carvedilol dose was reduced from 6.25 mg to 3.125 mg twice daily.  Echocardiogram done during inpatient stay 06/05/2023 noted LVEF 55 to 60%, normal wall motion.  Normal atrial size.  Trace MR, RVSP estimated 28 mmHg.  She was discharged home on aspirin 81 mg, atorvastatin 20 mg, carvedilol 3.125 mg twice daily, metformin 500 mg twice daily and sublingual nitroglycerin.  Here today for the visit by herself.  Overall a poor historian.  Much of the history obtained from medical records on epic and [address].  Mentions she continues to have atypical chest pain that she describes as a sensation of sharp feeling over the precordium worse with deep inspiration.  She occasionally feels a sensation of fast heartbeat.  Denies smoking.  Denies marijuana use.  Denies alcohol use  EKG in the today shows sinus rhythm heart rate 68/min, normal PR interval 144 ms, QRS duration 80 ms, QTc 429 ms.  Most recent repeat blood work from 06/04/2023 at [facility] noted sodium 139, potassium 3.7, BUN 19, creatinine 0.8, estimated GFR greater than 60.  AST 41, ALT 57. Hemoglobin A1c 6.3 Reviewed blood work from 06/06/2023 noted AST improved to 36 and ALT 58, alkaline phosphatase 81  Last lipid  panel from March 23, 2022 total cholesterol 203, triglycerides 225, HDL 34, LDL 129. TSH 2.4, free T41.19, normal. Hemoglobin A1c 6.4 on Nov 24, 2021, elevated  Has a 14-day event monitor from June 2023 with average heart rate 84/min [ranging from 57 to 132 bpm], rare ventricular ectopy and supraventricular ectopy burden less than 1%.  Echocardiogram June 2023 noted LVEF 60 to 65%, grade 1 diastolic dysfunction, normal RV function, mild MR  Past Medical History:  Diagnosis Date   Atypical chest pain 06/20/2017   Dyslipidemia 06/22/2017   Essential (primary) hypertension 07/22/2017   Gastro-esophageal reflux disease without esophagitis 06/15/2018   Hypertension    Insomnia 06/22/2017   Prediabetes 11/24/2021   Syncope and collapse 03/23/2018    Past Surgical History:  Procedure Laterality Date   APPENDECTOMY     CESAREAN SECTION     CHOLECYSTECTOMY      Current Medications: Current Meds  Medication Sig   aspirin EC 81 MG tablet Take 81 mg by mouth daily. Swallow whole.   atorvastatin (LIPITOR) 20 MG tablet Take 20 mg by mouth daily.   carvedilol (COREG) 6.25 MG tablet Take 3.125 mg by mouth 2 (two) times daily with a meal.   docusate sodium (COLACE) 100 MG capsule Take 100 mg by mouth 2 (two) times daily.   metFORMIN (GLUCOPHAGE) 500 MG tablet Take 500 mg by mouth 2 (two) times daily  with a meal.   nitroGLYCERIN (NITROSTAT) 0.4 MG SL tablet Place 0.4 mg under the tongue every 5 (five) minutes as needed for chest pain.     Allergies:   Patient has no known allergies.   Social History   Socioeconomic History   Marital status: Married    Spouse name: Not on file   Number of children: Not on file   Years of education: Not on file   Highest education level: Not on file  Occupational History   Not on file  Tobacco Use   Smoking status: Never   Smokeless tobacco: Never  Vaping Use   Vaping status: Never Used  Substance and Sexual Activity   Alcohol use: Not Currently     Alcohol/week: 2.0 standard drinks of alcohol    Types: 2 Glasses of wine per week   Drug use: No   Sexual activity: Not on file  Other Topics Concern   Not on file  Social History Narrative   Not on file   Social Determinants of Health   Financial Resource Strain: Not on File (11/02/2021)   Received from [employer]    Financial Resource Strain: 0  Food Insecurity: Not on File (11/02/2021)   Received from [facility] Insecurity    Food: 0  Transportation Needs: Not on File (11/02/2021)   Received from [employer] Needs    Transportation: 0  Physical Activity: Not on File (11/02/2021)   Received from [facility]   Physical Activity    Physical Activity: 0  Recent Concern: Physical Activity - At Risk (11/02/2021)   Received from [facility]   Physical Activity    Physical Activity: 2  Stress: Not on File (11/02/2021)   Received from [facility]   Stress    Stress: 0  Social Connections: Unknown (12/06/2021)   Received from [facility]   Social Network    Social Network: Not on file     Family History: The patient's family history includes Heart attack in her mother; Hypertension in her mother and sister. ROS:   Please see the history of present illness.    All 14 point review of systems negative except as described per history of present illness.  EKGs/Labs/Other Studies Reviewed:    The following studies were reviewed today:   EKG:       Recent Labs: No results found for requested labs within last 365 days.  Recent Lipid Panel    Component Value Date/Time   LDLDIRECT 124 (H) 12/07/2021 1035    Physical Exam:    VS:  BP 110/68   Pulse 68   Ht 4\' 9"  (1.448 m)   Wt 211 lb (95.7 kg)   SpO2 95%   BMI 45.66 kg/m     Wt Readings from Last 3 Encounters:  06/07/23 211 lb (95.7 kg)  12/07/21 196 lb 9.6 oz (89.2 kg)  11/02/21 199 lb (90.3 kg)     GENERAL:  Well nourished, well developed in no acute distress NECK: No JVD;  No carotid bruits CARDIAC: RRR, S1 and S2 present, no murmurs, no rubs, no gallops CHEST:  Clear to auscultation without rales, wheezing or rhonchi  Extremities: No pitting pedal edema. Pulses bilaterally symmetric with radial 2+ and dorsalis pedis 2+ NEUROLOGIC:  Alert and oriented x 3  Medication Adjustments/Labs and Tests Ordered: Current medicines are reviewed at length with the patient today.  Concerns regarding medicines are outlined above.  Orders Placed  This Encounter  Procedures   EKG 12-Lead   No orders of the defined types were placed in this encounter.   Signed, Cecille Amsterdam, MD, MPH, [facility]. 06/07/2023 3:25 PM    [facility]

## 2023-06-07 NOTE — Patient Instructions (Signed)
Medication Instructions:  Your physician recommends that you continue on your current medications as directed. Please refer to the Current Medication list given to you today.  *If you need a refill on your cardiac medications before your next appointment, please call your pharmacy*   Lab Work: BMET- Today   If you have labs (blood work) drawn today and your tests are completely normal, you will receive your results only by: MyChart Message (if you have MyChart) OR A paper copy in the mail If you have any lab test that is abnormal or we need to change your treatment, we will call you to review the results.   Testing/Procedures: Your physician has ordered for you to have a coronary CT. Please refer to instructions given   A zio monitor was ordered today. It will remain on for 14 days. You will then return monitor and event diary in provided box. It takes 1-2 weeks for report to be downloaded and returned to [place]. We will call you with the results. If monitor falls off or has orange flashing light, please call Zio for further instructions.     Follow-Up: At [facility], you and your health needs are our priority.  As part of our continuing mission to provide you with exceptional heart care, we have created designated Provider Care Teams.  These Care Teams include your primary Cardiologist (physician) and Advanced Practice Providers (APPs -  Physician Assistants and Nurse Practitioners) who all work together to provide you with the care you need, when you need it.  We recommend signing up for the patient portal called "MyChart".  Sign up information is provided on this After Visit Summary.  MyChart is used to connect with patients for Virtual Visits (Telemedicine).  Patients are able to view lab/test results, encounter notes, upcoming appointments, etc.  Non-urgent messages can be sent to your provider as well.   To learn more about what you can do with MyChart, go to  [email or web address].    Your next appointment:   4 -6 week(s)  Provider:   Huntley Dec, MD    Other Instructions   Your cardiac CT will be scheduled at one of the below locations:   [facility] [address] [place], [place] [address] [phone]    If scheduled at [facility], please arrive at the [facility] (Entrance C2) of [facility] 30 minutes prior to test start time. You can use the FREE valet parking offered at entrance C (encouraged to control the heart rate for the test)  Proceed to the [facility] Radiology Department (first floor) to check-in and test prep.  All radiology patients and guests should use entrance C2 at [facility], accessed from [facility], even though the hospital's physical address listed is [address].    If scheduled at [facility] or [facility], please arrive 15 mins early for check-in and test prep.  There is spacious parking and easy access to the radiology department from the [facility] entrance. Please enter here and check-in with the desk attendant.   Please follow these instructions carefully (unless otherwise directed):  An IV will be required for this test and Nitroglycerin will be given.  Hold all erectile dysfunction medications at least 3 days (72 hrs) prior to test. (Ie viagra, cialis, sildenafil, tadalafil, etc)   On the Night Before the Test: Be sure to Drink plenty of water. Do  not consume any caffeinated/decaffeinated beverages or chocolate 12 hours prior to your test. Do not take any antihistamines 12 hours prior to your test.  On the Day of the Test: Drink plenty of water until 1 hour prior to the test. Do not eat any food 1 hour prior to test. You may take your regular medications prior to the test.  Hold Carvedilol  Take metoprolol (Lopressor) two hours prior to  test. FEMALES- please wear underwire-free bra if available, avoid dresses & tight clothing   After the Test: Drink plenty of water. After receiving IV contrast, you may experience a mild flushed feeling. This is normal. On occasion, you may experience a mild rash up to 24 hours after the test. This is not dangerous. If this occurs, you can take Benadryl 25 mg and increase your fluid intake. If you experience trouble breathing, this can be serious. If it is severe call 911 IMMEDIATELY. If it is mild, please call our office. If you take any of these medications: Glipizide/Metformin, Avandament, Glucavance, please do not take 48 hours after completing test unless otherwise instructed.  We will call to schedule your test 2-4 weeks out understanding that some insurance companies will need an authorization prior to the service being performed.   For more information and frequently asked questions, please visit our website : [email or web address]  For non-scheduling related questions, please contact the cardiac imaging nurse navigator should you have any questions/concerns: Cardiac Imaging Nurse Navigators Direct Office Dial: [phone]   For scheduling needs, including cancellations and rescheduling, please call [place], [phone].  ZIO XT- Long Term Monitor Instructions  Your physician has requested you wear a ZIO patch monitor for 14 days.  This is a single patch monitor. [employer] supplies one patch monitor per enrollment. Additional stickers are not available. Please do not apply patch if you will be having a Nuclear Stress Test,  Echocardiogram, Cardiac CT, MRI, or Chest Xray during the period you would be wearing the  monitor. The patch cannot be worn during these tests. You cannot remove and re-apply the  ZIO XT patch monitor.  Your ZIO patch monitor will be mailed 3 day USPS to your address on file. It may take 3-5 days  to receive your monitor after you have been  enrolled.  Once you have received your monitor, please review the enclosed instructions. Your monitor  has already been registered assigning a specific monitor serial # to you.  Billing and Patient Assistance Program Information  We have supplied [employer] with any of your insurance information on file for billing purposes. [employer] offers a sliding scale Patient Assistance Program for patients that do not have  insurance, or whose insurance does not completely cover the cost of the ZIO monitor.  You must apply for the Patient Assistance Program to qualify for this discounted rate.  To apply, please call [employer] at [phone], select option 4, select option 2, ask to apply for  Patient Assistance Program. Meredeth Ide will ask your household income, and how many people  are in your household. They will quote your out-of-pocket cost based on that information.  [employer] will also be able to set up a 76-month, interest-free payment plan if needed.  Applying the monitor   Shave hair from upper left chest.  Hold abrader disc by orange tab. Rub abrader in 40 strokes over the upper left chest as  indicated in your monitor instructions.  Clean area with 4 enclosed alcohol pads. Let dry.  Apply patch as indicated in  monitor instructions. Patch will be placed under collarbone on left  side of chest with arrow pointing upward.  Rub patch adhesive wings for 2 minutes. Remove white label marked "1". Remove the white  label marked "2". Rub patch adhesive wings for 2 additional minutes.  While looking in a mirror, press and release button in center of patch. A small green light will  flash 3-4 times. This will be your only indicator that the monitor has been turned on.  Do not shower for the first 24 hours. You may shower after the first 24 hours.  Press the button if you feel a symptom. You will hear a small click. Record Date, Time and  Symptom in the Patient Logbook.  When you are ready to remove the  patch, follow instructions on the last 2 pages of Patient  Logbook. Stick patch monitor onto the last page of Patient Logbook.  Place Patient Logbook in the blue and white box. Use locking tab on box and tape box closed  securely. The blue and white box has prepaid postage on it. Please place it in the mailbox as  soon as possible. Your physician should have your test results approximately 7 days after the  monitor has been mailed back to [facility].  Call [facility] Customer Care at [phone] if you have questions regarding  your ZIO XT patch monitor. Call them immediately if you see an orange light blinking on your  monitor.  If your monitor falls off in less than 4 days, contact our Monitor department at [phone].  If your monitor becomes loose or falls off after 4 days call [employer] at [phone] for  suggestions on securing your monitor

## 2023-06-07 NOTE — Assessment & Plan Note (Signed)
Occurred while at work on 06-04-2023. Appears vasovagal in description in association with low blood pressures with carvedilol dose of 6.25 mg at the time. Cannot entirely exclude cardiac arrhythmias. Also was associated with chest pain pending further evaluation as below.  In this context will proceed with further evaluation with Zio patch for 14 days. Advised to keep herself well-hydrated.

## 2023-06-07 NOTE — Assessment & Plan Note (Signed)
Atypical chest pain, noncardiac in description today. Negative troponins. No significant cardiac structure and function abnormalities on echocardiogram inpatient at [facility]. [place] stress test with nuclear imaging however reported mild reversible defect mid to apical anterior wall, breast attenuation artifact versus ischemia.  In this context given her risk factors for cardiovascular disease, we will proceed with further evaluation with CT coronary angiogram.  Optimize heart rates with metoprolol tartrate 100 mg 1 dose on the morning of the study. To hold the morning dose of carvedilol on the morning of the study when she is taking metoprolol tartrate.  Will review the results once available and further follow-up based on test results.

## 2023-06-08 LAB — BASIC METABOLIC PANEL
BUN/Creatinine Ratio: 30 — ABNORMAL HIGH (ref 9–23)
BUN: 21 mg/dL (ref 6–24)
CO2: 21 mmol/L (ref 20–29)
Calcium: 9.6 mg/dL (ref 8.7–10.2)
Chloride: 104 mmol/L (ref 96–106)
Creatinine, Ser: 0.71 mg/dL (ref 0.57–1.00)
Glucose: 92 mg/dL (ref 70–99)
Potassium: 4.3 mmol/L (ref 3.5–5.2)
Sodium: 140 mmol/L (ref 134–144)
eGFR: 102 mL/min/{1.73_m2} (ref >59)

## 2023-07-05 ENCOUNTER — Ambulatory Visit: Payer: Self-pay

## 2023-07-26 ENCOUNTER — Telehealth: Payer: Self-pay

## 2023-07-26 NOTE — Telephone Encounter (Signed)
Appointment changed

## 2023-07-26 NOTE — Telephone Encounter (Signed)
Do you know wy this has not been done?

## 2023-08-01 ENCOUNTER — Telehealth (HOSPITAL_COMMUNITY): Payer: Self-pay | Admitting: *Deleted

## 2023-08-01 NOTE — Telephone Encounter (Signed)
Attempted to call patient regarding upcoming cardiac CT appointment. Unable to leave a message.  Jelani Trueba RN Navigator Cardiac Imaging [facility] [phone] Office [phone] Cell  

## 2023-08-02 ENCOUNTER — Ambulatory Visit (HOSPITAL_BASED_OUTPATIENT_CLINIC_OR_DEPARTMENT_OTHER): Admission: RE | Admit: 2023-08-02 | Payer: Self-pay | Source: Ambulatory Visit

## 2023-08-03 ENCOUNTER — Ambulatory Visit: Payer: Self-pay

## 2023-08-04 ENCOUNTER — Ambulatory Visit: Payer: Self-pay
